# Patient Record
Sex: Male | Born: 1990 | Race: White | Hispanic: No | Marital: Married | Smoking: Never smoker
Health system: Southern US, Community
[De-identification: ages and names within clinical notes are randomized; demographics above are authoritative.]

## PROBLEM LIST (undated history)

## (undated) DIAGNOSIS — H9319 Tinnitus, unspecified ear: Secondary | ICD-10-CM

## (undated) DIAGNOSIS — H9190 Unspecified hearing loss, unspecified ear: Secondary | ICD-10-CM

## (undated) HISTORY — DX: Unspecified hearing loss, unspecified ear: H91.90

## (undated) HISTORY — DX: Tinnitus, unspecified ear: H93.19

---
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
## 2011-02-22 NOTE — Op Note (Signed)
NAMENAITHAN, DELAGE               ACCOUNT NO.:  [id number]   MEDICAL RECORD NO.:  [id number]          PATIENT TYPE:  AMB   LOCATION:  DSC                          FACILITY:  [facility]   PHYSICIAN:  Christopher E. Ezzard Standing, M.D.DATE OF BIRTH:  [birth year]   DATE OF PROCEDURE:  11/14/2006  DATE OF DISCHARGE:                               OPERATIVE REPORT   PREOPERATIVE DIAGNOSIS:  Mucocele, ventral surface, right side of tongue  (2 to 2.5-cm size).   POSTOPERATIVE DIAGNOSIS:  Mucocele, ventral surface, right side of  tongue (2 to 2.5-cm size).   OPERATION:  Excision of mucocele from the ventral surface of the right  side of tongue.   SURGEON:  Kristine Garbe. Ezzard Standing, M.D.   ANESTHESIA:  General.   COMPLICATIONS:  None.   BRIEF CLINICAL NOTE:  Jose Wallace is a 20 year old, who has had a  persistent cyst under the right side of his tongue that has gradually  gotten larger in the last couple months.  He has opened it previously  with a needle and has it drained, but it keeps recurring and it is  getting bigger.  One exam in the office, Jose Wallace has an approximately 2-  cm mucocele under the ventral surface of the right side of the tongue.  He is taken to the operating room at this time for excision of the large  tongue mucocele.   DESCRIPTION OF PROCEDURE:  The patient underwent general anesthesia with  an LMA.  An incision through the mucosa was made directly over the  mucocele and extended proximally and distally.  A plane was then  developed between the mucosa and the mucocele.  In an area where it was  adherent a little bit to the mucosa, the mucocele was opened and thick  clear mucus was expressed from the mucocele.  The remaining mucocele was  excised.  A small bleeder was ligated with 4-0 chromic suture.  Cautery  was used to obtain hemostasis of venous bleeding.  The defect was then  closed with interrupted 4-0 chromic sutures times 5.  This completed the  procedure.  Jose Wallace  was awakened from anesthesia and transferred to the  recovery room postoperatively doing well.   DISPOSITION:  Jose Wallace will be discharged home on amoxicillin 500 mg  b.i.d. for 1 week, Tylenol and Lortab elixir 2 to 3 teaspoons q.4 h.  p.r.n. pain, Hurricaine for topical anesthesia, and will have him follow  up in my office in 1 week for recheck.           ______________________________  Kristine Garbe. Ezzard Standing, M.D.     CEN/MEDQ  D:  11/14/2006  T:  11/14/2006  Job:  [id number]

## 2011-02-22 NOTE — Op Note (Signed)
[place]. [facility]  Patient:    SALLIE, MAKER Visit Number: [id number] MRN: [id number]          Service Type: DSU Location: [facility] Attending Physician:  Carlos Levering Dictated by:   Hyman Bible Pendse, M.D. Proc. Date: 01/01/02 Admit Date:  01/01/2002   CC:         Casper Harrison, M.D.   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  OPERATION PERFORMED:  Wide excision of recurrent melanocytic congenital nevus of left scapular area and layered repair.  SURGEON:  Prabhakar D. Levie Heritage, M.D.  ASSISTANT:  Nurse.  ANESTHESIA:  General.  OPERATIVE INDICATIONS:  This young boy underwent excision of pigmented nevus of left scapular area on September 07, 2001.  The histology report was consistent with melanocytic nevus with features suggestive of congenital nevus.  No significant atypia was appreciated.  The area healed without problems initially.  However, later on pigmented nevus recurred.  Hence, wider excision was planned.  OPERATIVE PROCEDURE:  Under satisfactory general anesthesia, the patient in the right lateral position, the left scapular area was thoroughly prepped and draped in the usual manner with a few millimeters margins around the lesion. Elliptical incision was made.  Skin and subcutaneous tissue incised.  0.25% Marcaine with epinephrine was injected locally for postop anesthesia.  By blunt and sharp dissection the entire segment of the left scapula of skin together with the mole and a few millimeters of margin were excised with subcutaneous tissue.  After complete excision, bleeders clamped, cut and ligated.  Deeper layers were reapproximated with 4-0 Vicryl interrupted sutures.  Skin closed with 4-0 nylon subcuticular suture.  Steri-Strips and appropriate pressure dressing  applied.  Throughout the procedure, the patients vital signs remained stable.  The patient withstood the procedure well and was transferred to the recovery room in satisfactory general condition. Dictated by:   Hyman Bible Pendse, M.D. Attending Physician:  Carlos Levering DD:  01/01/02 TD:  01/02/02 Job: [id number]

## 2015-09-04 ENCOUNTER — Other Ambulatory Visit: Payer: Self-pay | Admitting: *Deleted

## 2015-09-04 NOTE — Telephone Encounter (Signed)
Denied refill for Montelukast 10 mg to [facility].  Patient needs Office Visit.  Last OV 05/27/14.

## 2017-10-20 ENCOUNTER — Ambulatory Visit: Payer: Self-pay

## 2017-10-20 ENCOUNTER — Other Ambulatory Visit: Payer: Self-pay | Admitting: Occupational Medicine

## 2017-10-20 DIAGNOSIS — S8992XA Unspecified injury of left lower leg, initial encounter: Secondary | ICD-10-CM

## 2019-03-11 ENCOUNTER — Other Ambulatory Visit: Payer: Self-pay | Admitting: Nurse Practitioner

## 2019-03-11 ENCOUNTER — Ambulatory Visit
Admission: RE | Admit: 2019-03-11 | Discharge: 2019-03-11 | Disposition: A | Discharge status: Home / Self Care | Payer: No Typology Code available for payment source | Source: Ambulatory Visit | Attending: Nurse Practitioner | Admitting: Nurse Practitioner

## 2019-03-11 ENCOUNTER — Other Ambulatory Visit: Payer: Self-pay

## 2019-03-11 DIAGNOSIS — Z021 Encounter for pre-employment examination: Secondary | ICD-10-CM

## 2019-04-24 IMAGING — DX DG KNEE COMPLETE 4+V*L*
4 series · 4 of 4 positions shown · non-contrast
Comparison: None.

CLINICAL DATA: Pain following fall with twisting injury

EXAM:
LEFT KNEE - COMPLETE 4+ VIEW

[knee pa]
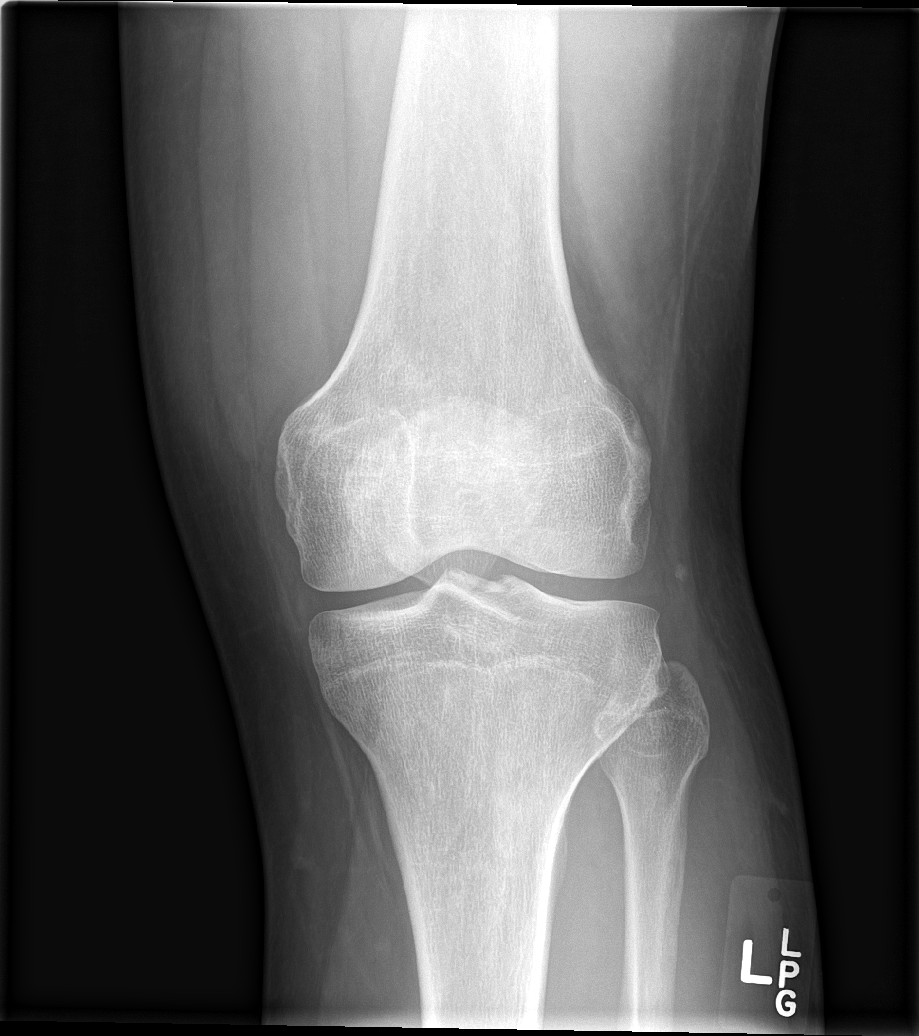

[knee obl (1 of 2)]
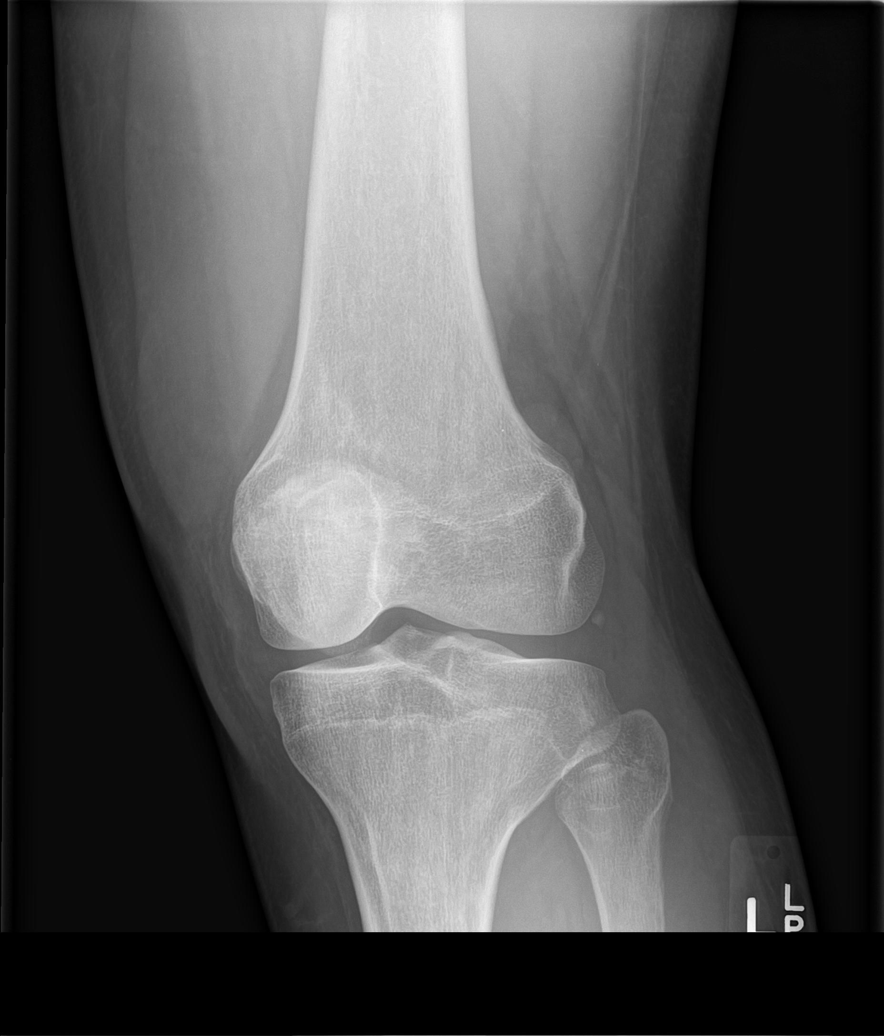

[knee obl (2 of 2)]
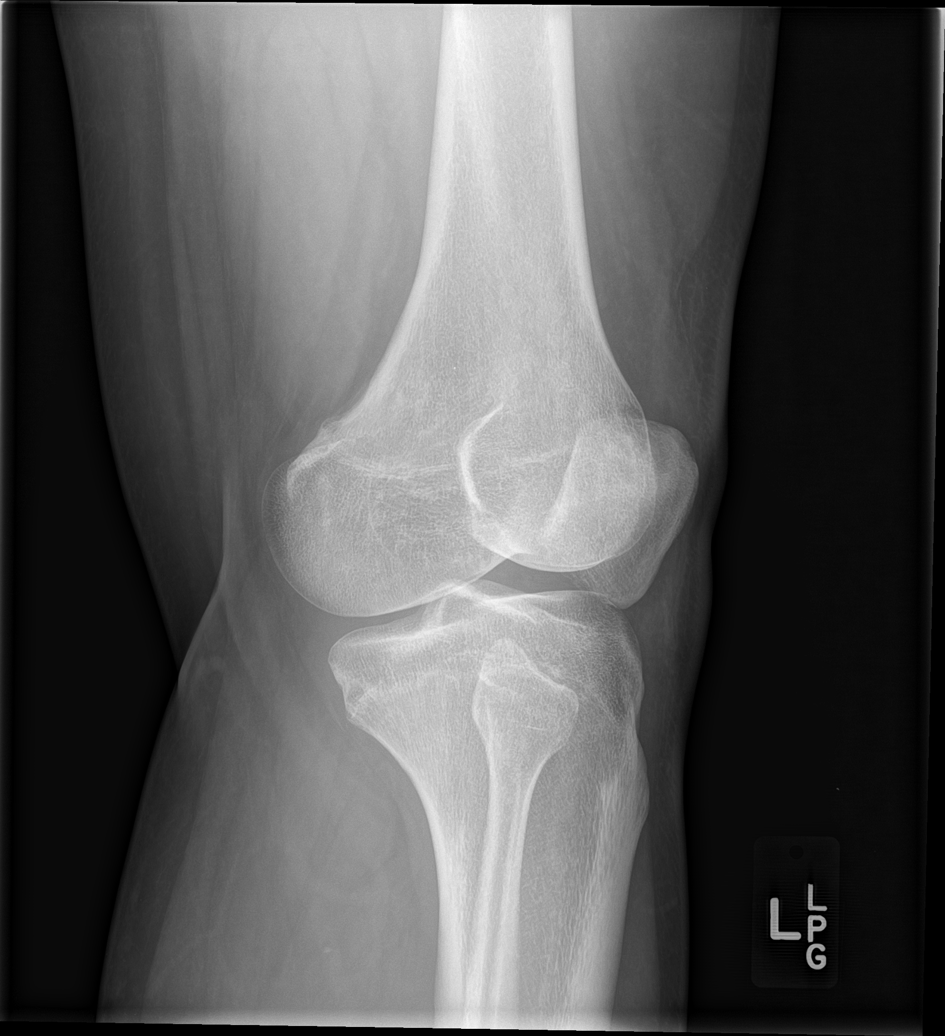

[knee lat]
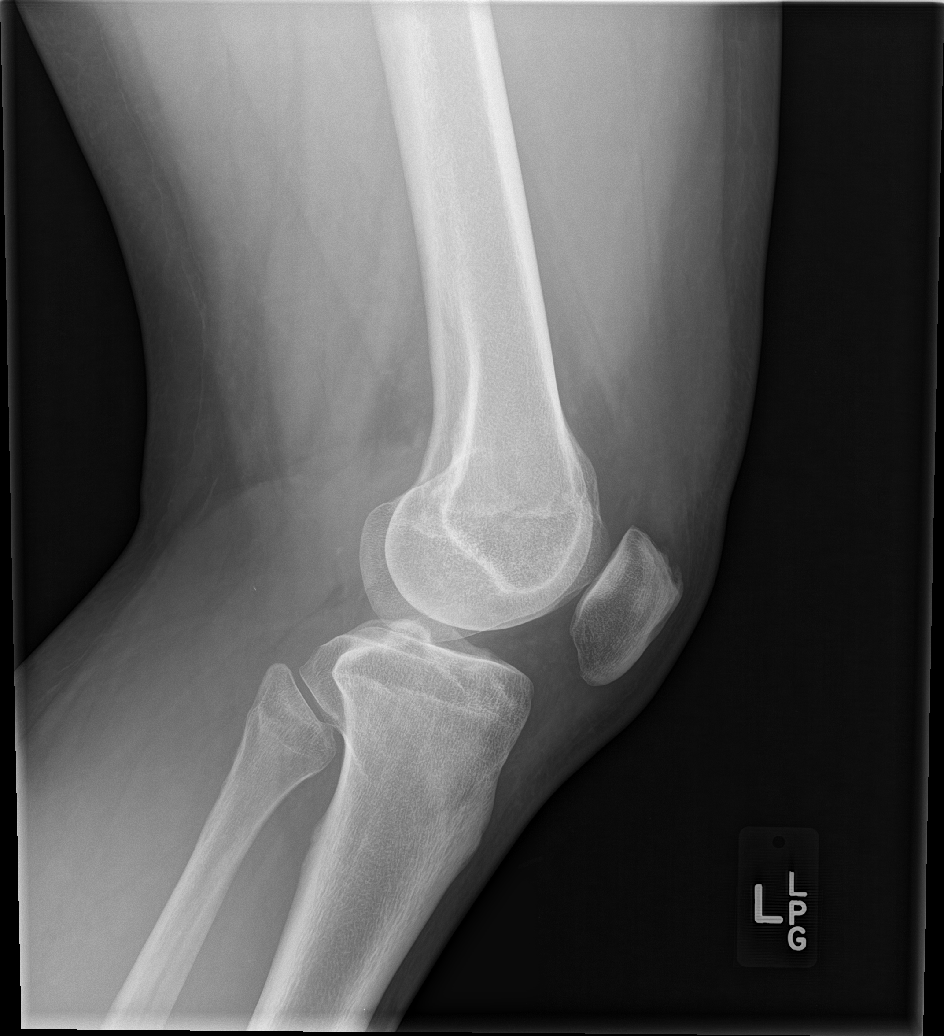

[4 of 4 positions shown; findings below may reference images not displayed]

FINDINGS: Frontal, lateral, and bilateral oblique views were obtained.
Frontal, lateral, and bilateral oblique views were obtained. There
is no fracture or dislocation. No joint effusion. Joint spaces
appear normal. There is a small spur along the anterior superior
patella. No erosive change.
IMPRESSION: Small spur along the anterior superior patella, likely due to distal
quadriceps tendinosis. No appreciable joint space narrowing. No
fracture or joint effusion.

## 2020-06-05 ENCOUNTER — Encounter (HOSPITAL_COMMUNITY): Payer: Self-pay | Admitting: Emergency Medicine

## 2020-06-05 ENCOUNTER — Other Ambulatory Visit: Payer: Self-pay

## 2020-06-05 ENCOUNTER — Ambulatory Visit (HOSPITAL_COMMUNITY)
Admission: EM | Admit: 2020-06-05 | Discharge: 2020-06-05 | Disposition: A | Discharge status: Home / Self Care | Payer: Federal, State, Local not specified - PPO | Attending: Family Medicine | Admitting: Family Medicine

## 2020-06-05 DIAGNOSIS — H60502 Unspecified acute noninfective otitis externa, left ear: Secondary | ICD-10-CM

## 2020-06-05 DIAGNOSIS — H66002 Acute suppurative otitis media without spontaneous rupture of ear drum, left ear: Secondary | ICD-10-CM | POA: Diagnosis not present

## 2020-06-05 MED ORDER — AMOXICILLIN 875 MG PO TABS: 875.0000 mg | ORAL_TABLET | Freq: Two times a day (BID) | ORAL | 0 refills | Status: AC

## 2020-06-05 MED ORDER — CIPROFLOXACIN-DEXAMETHASONE 0.3-0.1 % OT SUSP: 4.0000 [drp] | Freq: Two times a day (BID) | OTIC | 0 refills | Status: AC

## 2020-06-05 NOTE — ED Triage Notes (Signed)
Pt c/o left ear fullness onset last week. Pt states he used peroxide and an ear wax removal kit to remove wax. Pt states ear is very sore and itchy on the inside. He also states he has hx of tinnitus but this is louder and worse.  

## 2020-06-05 NOTE — ED Provider Notes (Signed)
[name]   [id number] 06/05/20 Arrival Time: 5277  ASSESSMENT & PLAN:  1. Non-recurrent acute suppurative otitis media of left ear without spontaneous rupture of tympanic membrane   2. Acute otitis externa of left ear, unspecified type     Begin: Meds ordered this encounter  Medications  . ciprofloxacin-dexamethasone (CIPRODEX) OTIC suspension    Sig: Place 4 drops into the left ear 2 (two) times daily.    Dispense:  7.5 mL    Refill:  0  . amoxicillin (AMOXIL) 875 MG tablet    Sig: Take 1 tablet (875 mg total) by mouth 2 (two) times daily for 10 days.    Dispense:  20 tablet    Refill:  0    Recommend:  Follow-up Information    [facility].   Why: If worsening or failing to improve as anticipated. Contact information: [address] [place] [address] [phone]               Reviewed expectations re: course of current medical issues. Questions answered. Outlined signs and symptoms indicating need for more acute intervention. Patient verbalized understanding. After Visit Summary given.   SUBJECTIVE: History from: patient.  Jose Wallace is a 29 y.o. male who presents with complaint of left otalgia; without drainage; without bleeding. One week. Ear wax removal two d ago with H2O2 and ear wax removal kit. Mild to moderate pain in left ear now. Decreased hearing at times. Worsening of chronic tinnitus. Afebrile. .     OBJECTIVE:  Vitals:   06/05/20 1517  BP: (!) 155/76  Pulse: 69  Resp: 15  Temp: 98.2 F (36.8 C)  TempSrc: Oral  SpO2: 100%     General appearance: alert; NAD Ear Canal: edema and inflammation on the left TM: left: erythematous, dull, bulging Neck: supple without LAD Lungs: unlabored respirations Skin: warm and dry Psychological: alert and cooperative; normal mood and affect  No Known Allergies  Past Medical History:  Diagnosis Date  . Hearing loss   . Tinnitus     Family History  Problem Relation Age of Onset  . Fibromyalgia Mother   . Cancer Father    Social History   Socioeconomic History  . Marital status: Married    Spouse name: Not on file  . Number of children: Not on file  . Years of education: Not on file  . Highest education level: Not on file  Occupational History  . Not on file  Tobacco Use  . Smoking status: Never Smoker  . Smokeless tobacco: Never Used  Vaping Use  . Vaping Use: Every day  Substance and Sexual Activity  . Alcohol use: Yes    Alcohol/week: 1.0 standard drink    Types: 1 Cans of beer per week    Comment: seldom  . Drug use: Not on file  . Sexual activity: Not on file  Other Topics Concern  . Not on file  Social History Narrative  . Not on file   Social Determinants of Health   Financial Resource Strain:   . Difficulty of Paying Living Expenses: Not on file  Food Insecurity:   . Worried About Charity fundraiser in the Last Year: Not on file  . Ran Out of Food in the Last Year: Not on file  Transportation Needs:   . Lack of Transportation (Medical): Not on file  . Lack of Transportation (Non-Medical): Not on file  Physical Activity:   .  Days of Exercise per Week: Not on file  . Minutes of Exercise per Session: Not on file  Stress:   . Feeling of Stress : Not on file  Social Connections:   . Frequency of Communication with Friends and Family: Not on file  . Frequency of Social Gatherings with Friends and Family: Not on file  . Attends Religious Services: Not on file  . Active Member of Clubs or Organizations: Not on file  . Attends Archivist Meetings: Not on file  . Marital Status: Not on file  Intimate Partner Violence:   . Fear of Current or Ex-Partner: Not on file  . Emotionally Abused: Not on file  . Physically Abused: Not on file  . Sexually Abused: Not on file            Vanessa Kick, MD 06/05/20 1556

## 2020-09-12 IMAGING — CR CHEST  1 VIEW
1 series · 1 of 1 positions shown · non-contrast
Comparison: None.

CLINICAL DATA: Pre-employment chest x-ray.

EXAM:
CHEST  1 VIEW

[w chest pa]
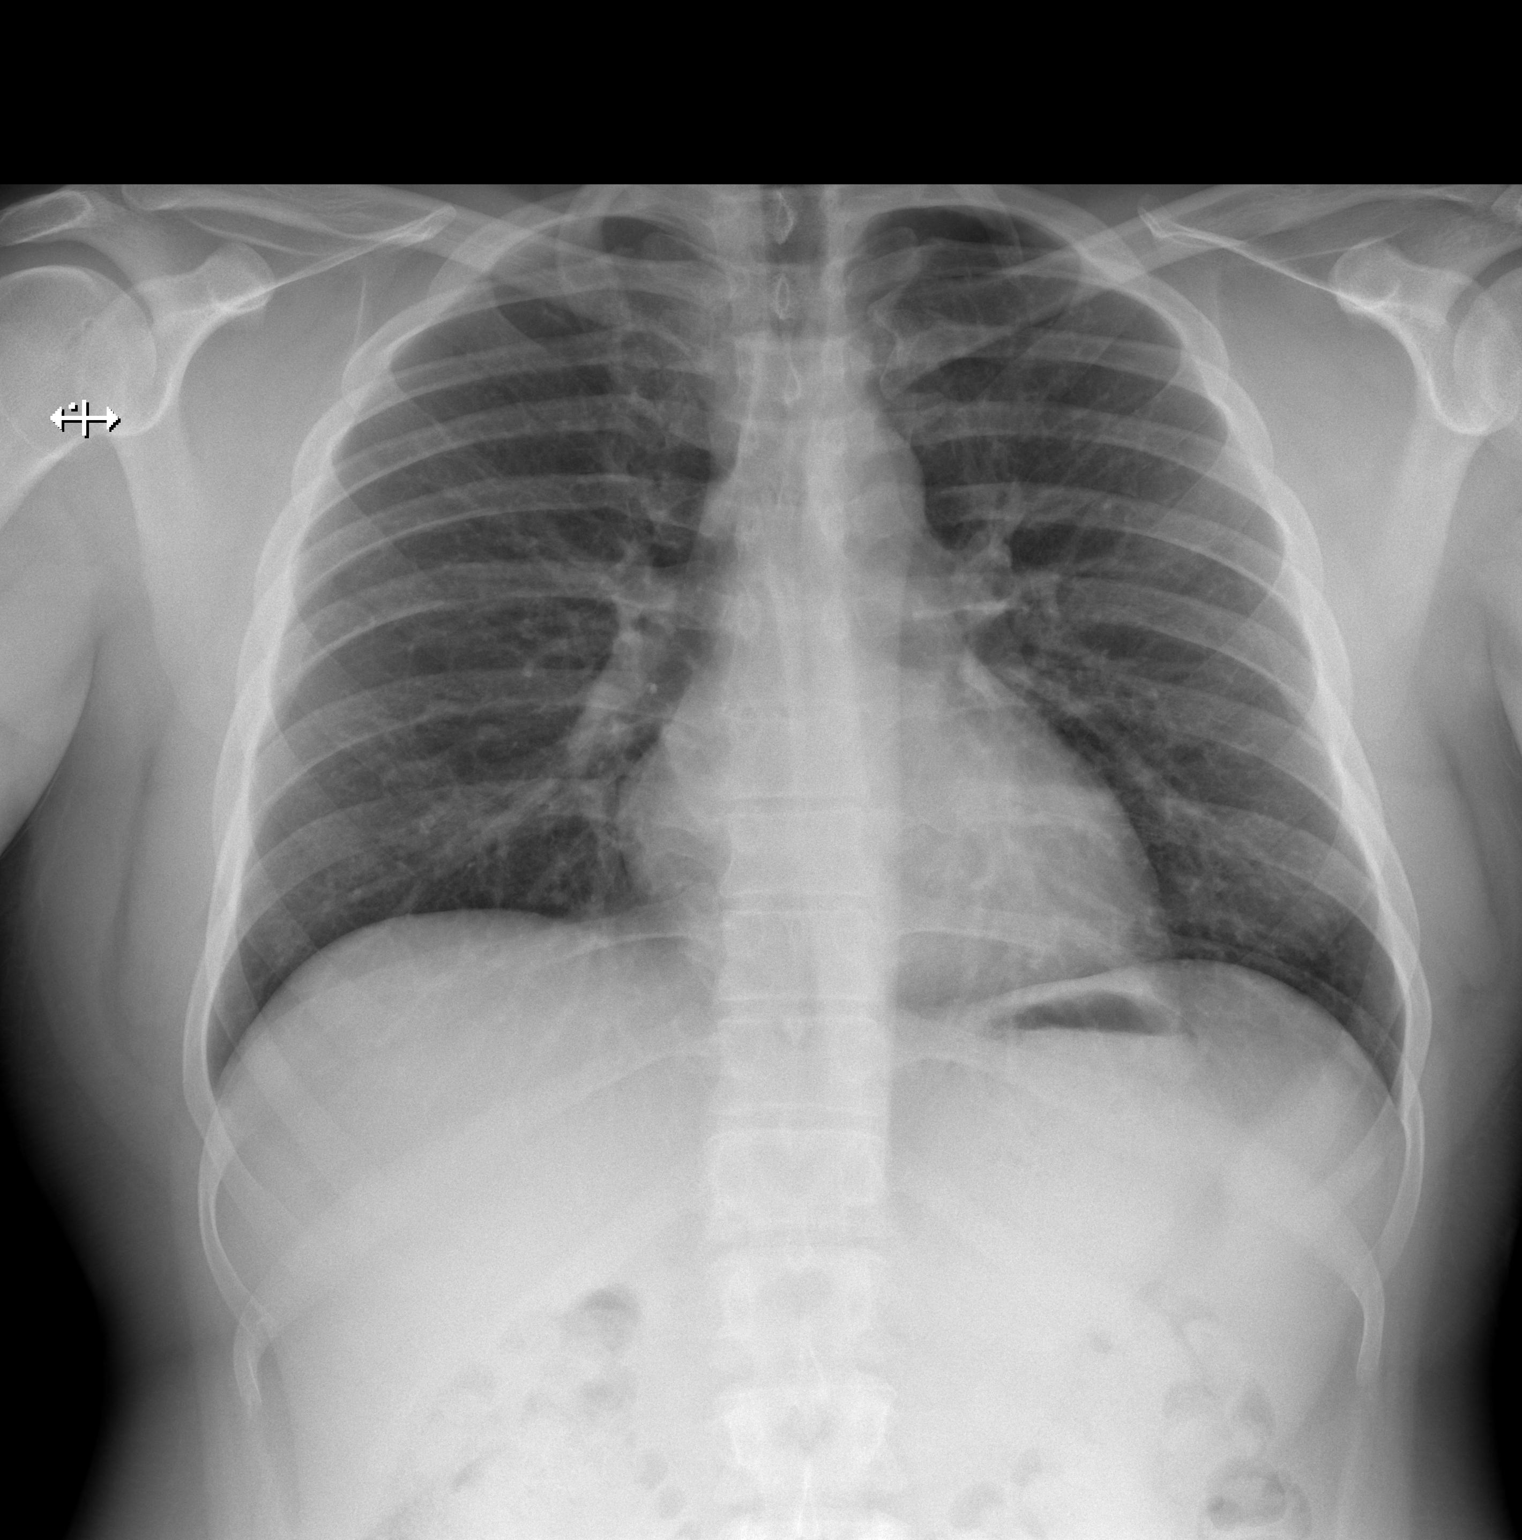

[1 of 1 positions shown; findings below may reference images not displayed]

FINDINGS: The heart size and mediastinal contours are within normal limits.
Both lungs are clear. The visualized skeletal structures are
unremarkable.
IMPRESSION: No active disease.
